# Patient Record
Sex: Male | Born: 1995 | Race: Black or African American | Hispanic: No | Marital: Single | Smoking: Never smoker
Health system: Southern US, Community
[De-identification: ages and names within clinical notes are randomized; demographics above are authoritative.]

## PROBLEM LIST (undated history)

## (undated) HISTORY — PX: ANTERIOR CRUCIATE LIGAMENT REPAIR: SHX115

---
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
## 2014-07-18 IMAGING — CR RIGHT MIDDLE FINGER 2+V
1 series · 3 of 3 positions shown · non-contrast
Comparison: Right hand radiographs 03/25/2010.

CLINICAL DATA: Right 3rd finger injury.

EXAM:
RIGHT MIDDLE FINGER 2+V

[Series 1: pa · 0.17mm/px · 3 of 3 slices shown]
[im 1/3]
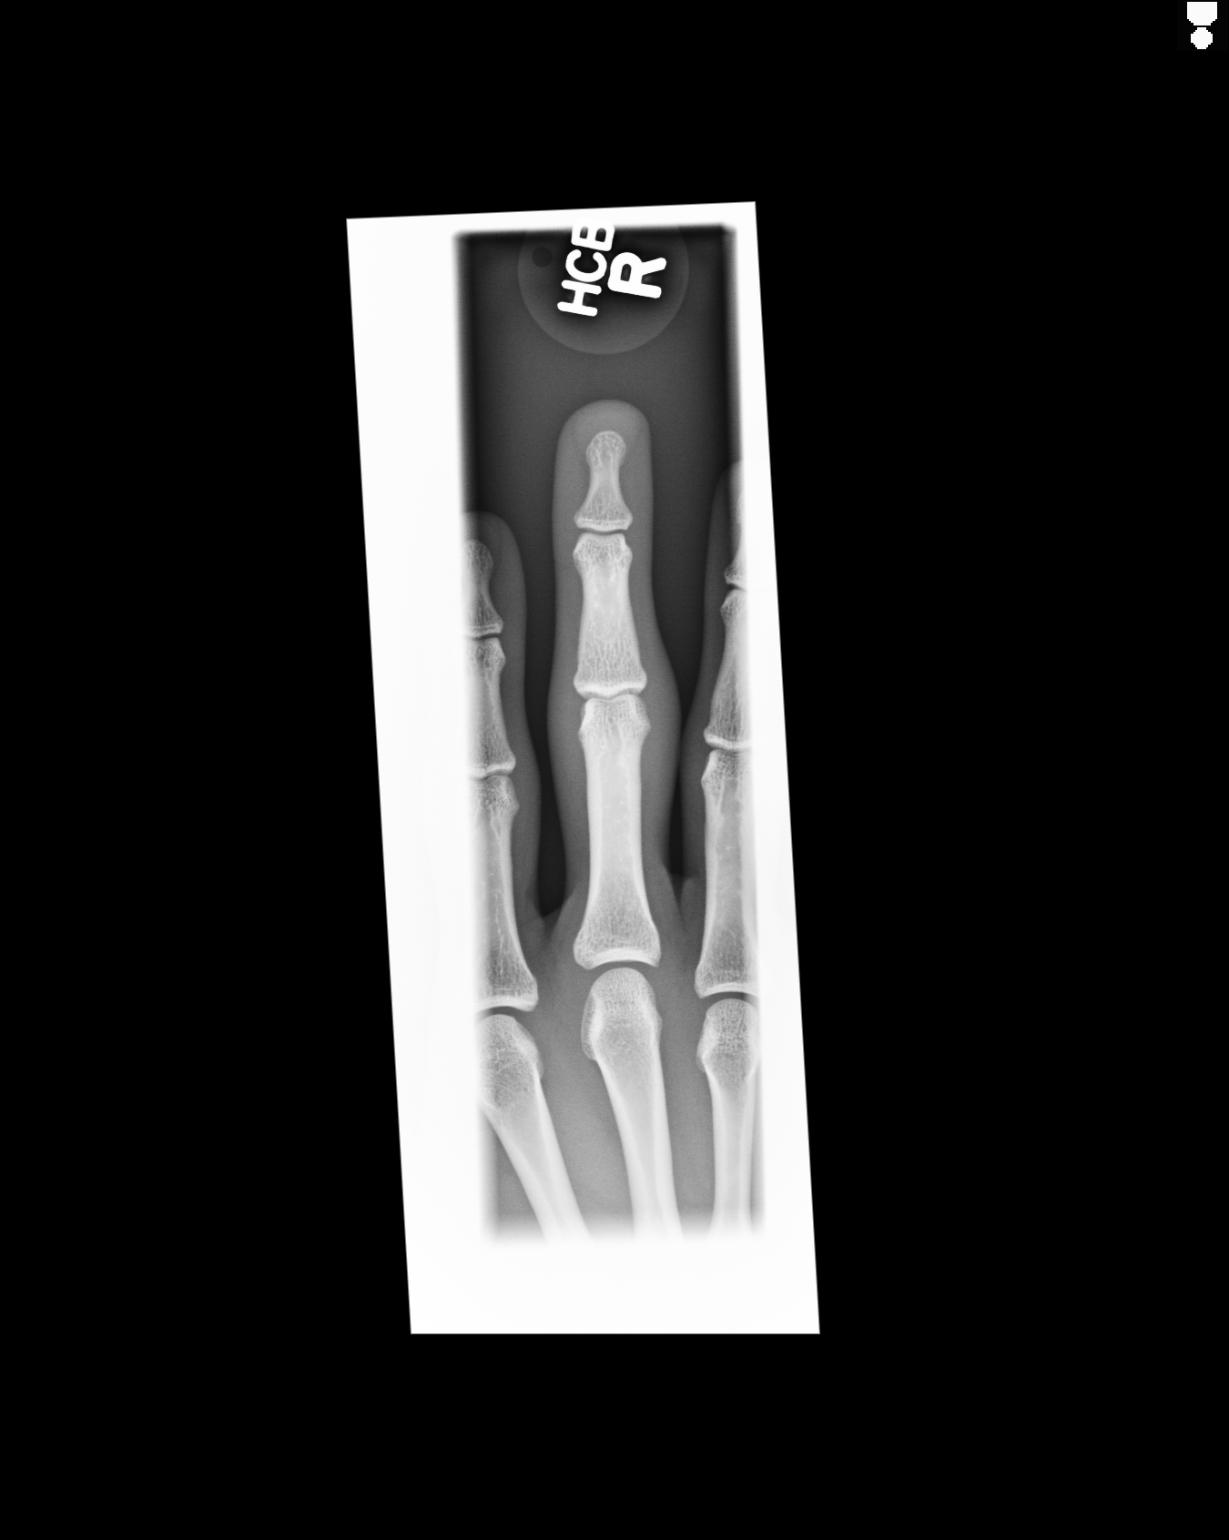
[im 2/3]
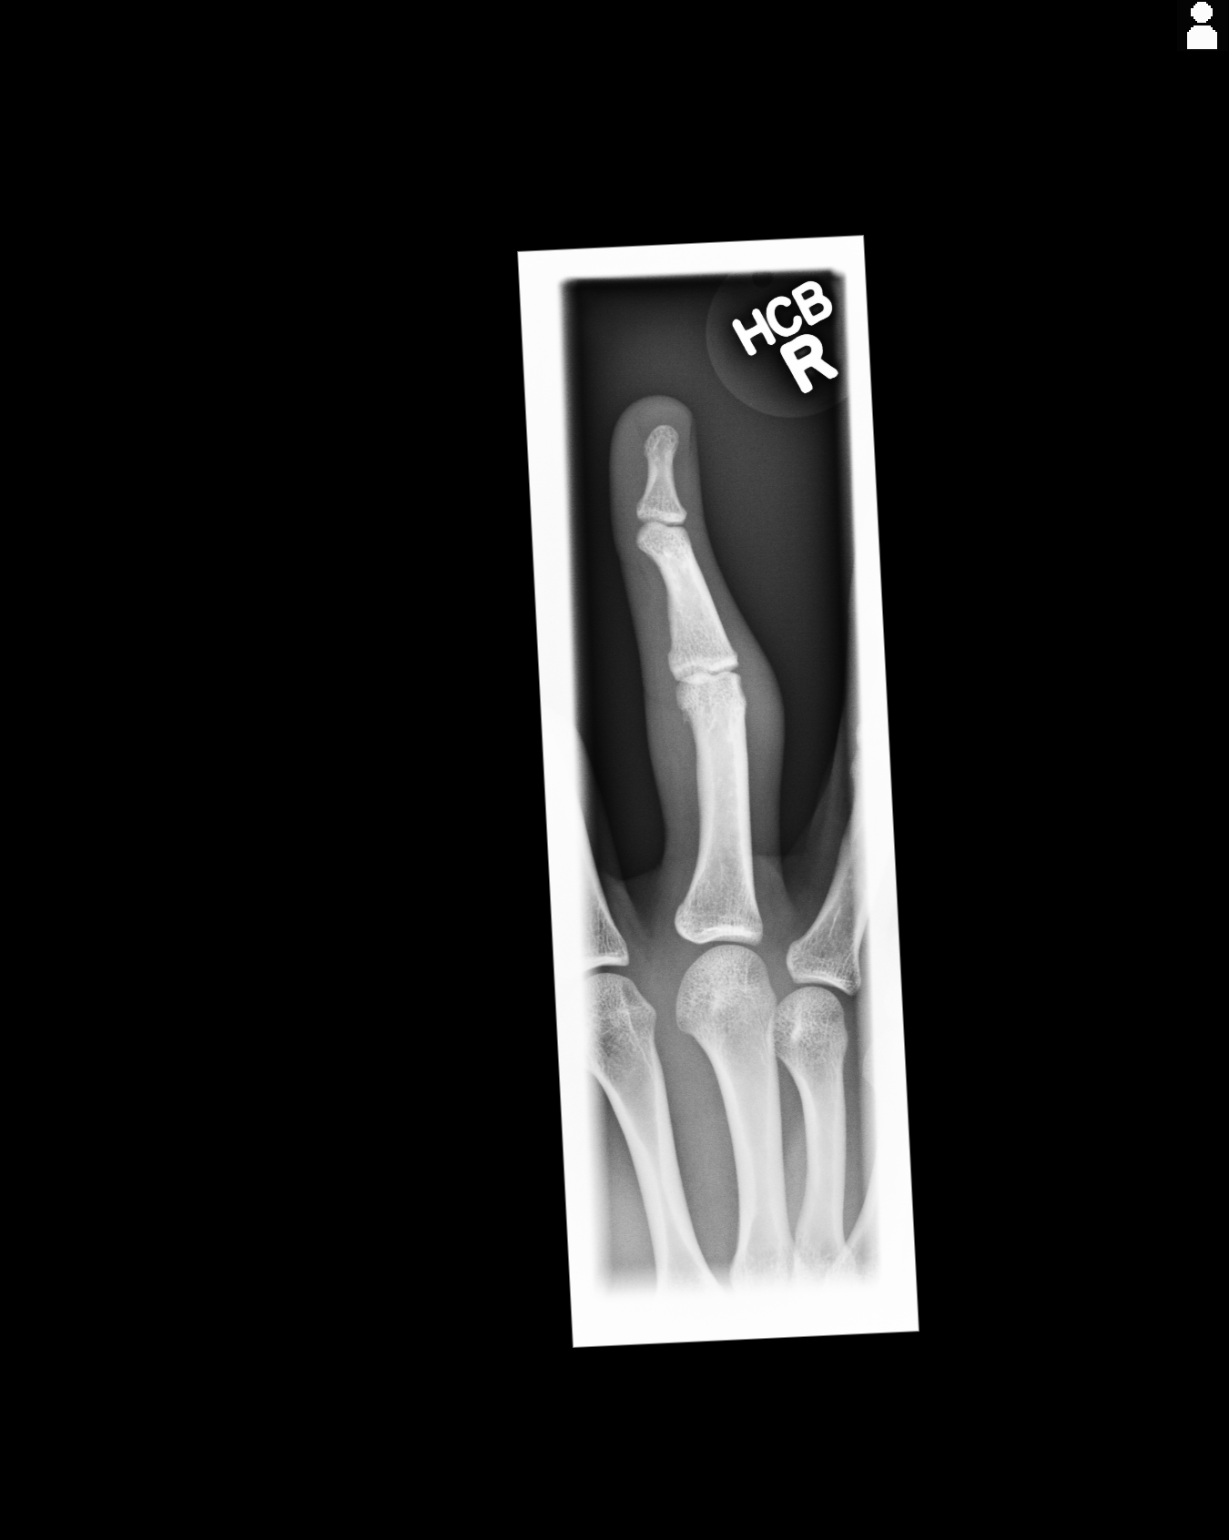
[im 3/3]
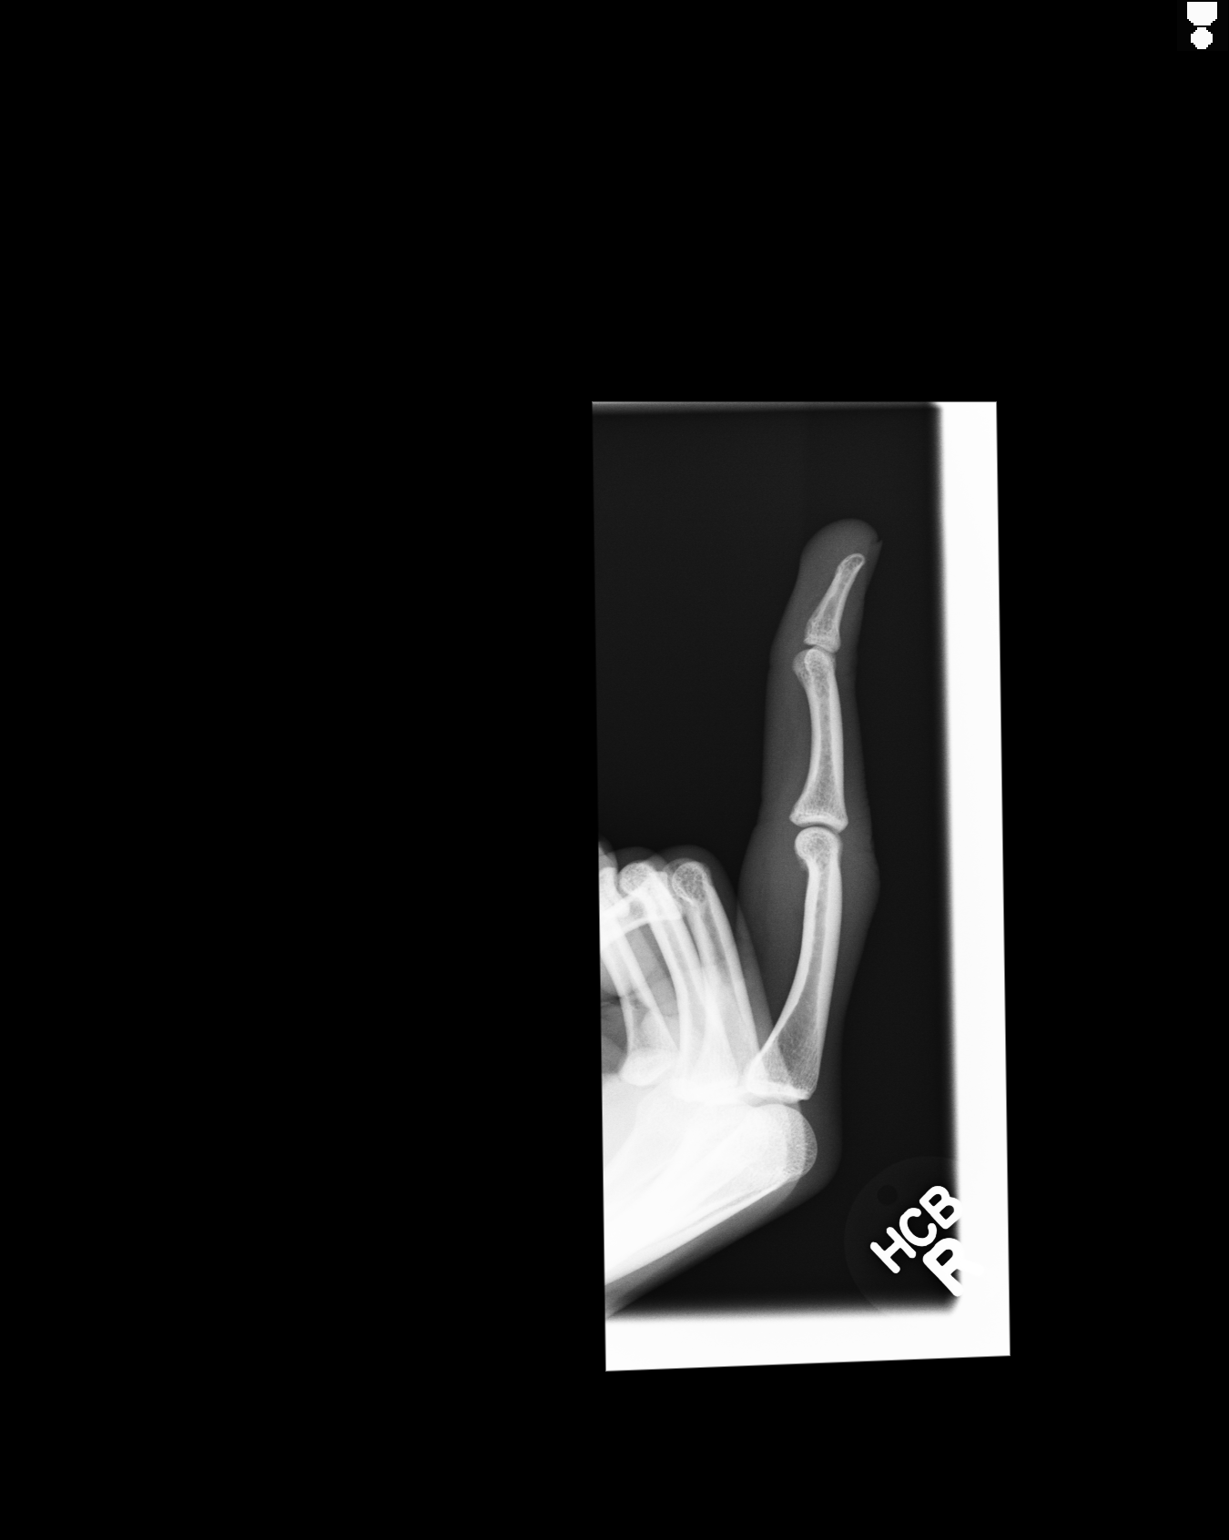

[3 of 3 positions shown; findings below may reference images not displayed]

FINDINGS: Soft tissue swelling is present about the PIP joint. No acute
osseous abnormality is evident. The DIP joint is slightly
hyperexpanded. The DIP joint of the index finger was extended on the
previous film.
IMPRESSION: And soft tissue swelling about the PIP joint without evidence for an
acute fracture.

## 2021-09-18 ENCOUNTER — Other Ambulatory Visit: Payer: Self-pay

## 2021-09-18 ENCOUNTER — Ambulatory Visit
Admission: EM | Admit: 2021-09-18 | Discharge: 2021-09-18 | Disposition: A | Discharge status: Home / Self Care | Payer: BC Managed Care – PPO | Attending: Emergency Medicine | Admitting: Emergency Medicine

## 2021-09-18 ENCOUNTER — Encounter: Payer: Self-pay | Admitting: Emergency Medicine

## 2021-09-18 DIAGNOSIS — B309 Viral conjunctivitis, unspecified: Secondary | ICD-10-CM | POA: Insufficient documentation

## 2021-09-18 DIAGNOSIS — R300 Dysuria: Secondary | ICD-10-CM | POA: Insufficient documentation

## 2021-09-18 LAB — URINALYSIS, COMPLETE (UACMP) WITH MICROSCOPIC
Bilirubin Urine: NEGATIVE
Glucose, UA: NEGATIVE mg/dL
Hgb urine dipstick: NEGATIVE
Ketones, ur: 40 mg/dL — AB
Leukocytes,Ua: NEGATIVE
Nitrite: NEGATIVE
Protein, ur: NEGATIVE mg/dL
Specific Gravity, Urine: 1.02 (ref 1.005–1.030)
pH: 5.5 (ref 5.0–8.0)

## 2021-09-18 NOTE — ED Triage Notes (Signed)
Patient c/o burning when urinating that started 4 days ago.  Patient c/o redness in his right eye that started last night.  Patient reports irritation in his right eye.

## 2021-09-18 NOTE — ED Provider Notes (Signed)
[facility]    CSN: [id number] Arrival date & time: 09/18/21  1256      History   Chief Complaint Chief Complaint  Patient presents with   Dysuria   Eye Problem    right    HPI Dominic Salazar is a 25 y.o. male.   HPI  Dysuria: Patient reports that he has had some dysuria for the past 4 days.  Symptoms are stable and not worsening.  He denies any significant urinary frequency and no pelvic pain, testicular pain, fever or flulike symptoms.  No no new sexual partners.  He has tried to stay hydrated with water for symptoms.   Eye problem: Patient reports that earlier today his eyes felt a bit scratchy.  Occurred in both eyes but worse in the right eye.  He reports that he wears safety glasses at work and denies having any foreign body injury to the eye.  He is scratch his eyes and they feel better now.  He does not wear contacts, no visual changes. Of note he denies any back pain go along with his current symptoms.  History reviewed. No pertinent past medical history.  There are no problems to display for this patient.   Past Surgical History:  Procedure Laterality Date   ANTERIOR CRUCIATE LIGAMENT REPAIR Left        Home Medications    Prior to Admission medications   Not on File    Family History History reviewed. No pertinent family history.  Social History Social History   Tobacco Use   Smoking status: Never   Smokeless tobacco: Never  Vaping Use   Vaping Use: Never used  Substance Use Topics   Alcohol use: Yes   Drug use: Never     Allergies   Amoxicillin   Review of Systems Review of Systems  As stated above in HPI Physical Exam Triage Vital Signs ED Triage Vitals  Enc Vitals Group     BP 09/18/21 1344 (!) 147/86     Pulse Rate 09/18/21 1344 (!) 58     Resp 09/18/21 1344 16     Temp 09/18/21 1344 99 F (37.2 C)     Temp Source 09/18/21 1344 Oral     SpO2 09/18/21 1344 100 %     Weight 09/18/21 1341 150 lb (68 kg)      Height 09/18/21 1341 5\' 9"  (1.753 m)     Head Circumference --      Peak Flow --      Pain Score 09/18/21 1341 0     Pain Loc --      Pain Edu? --      Excl. in GC? --    No data found.  Updated Vital Signs BP (!) 147/86 (BP Location: Right Arm)   Pulse (!) 58   Temp 99 F (37.2 C) (Oral)   Resp 16   Ht 5\' 9"  (1.753 m)   Wt 150 lb (68 kg)   SpO2 100%   BMI 22.15 kg/m   Visual Acuity Right Eye Distance: 20/20 uncorrected Left Eye Distance: 20/20 uncorrected Bilateral Distance: 20/20 uncorrected  Right Eye Near:   Left Eye Near:    Bilateral Near:     Physical Exam Vitals and nursing note reviewed.  Constitutional:      General: He is not in acute distress.    Appearance: Normal appearance. He is not ill-appearing, toxic-appearing or diaphoretic.  HENT:     Head: Normocephalic and atraumatic.  Right Ear: Tympanic membrane normal.     Left Ear: Tympanic membrane normal.     Nose: Nose normal.     Mouth/Throat:     Mouth: Mucous membranes are moist.     Pharynx: Oropharynx is clear. No oropharyngeal exudate or posterior oropharyngeal erythema.  Eyes:     Extraocular Movements: Extraocular movements intact.     Pupils: Pupils are equal, round, and reactive to light.     Comments: Conjunctiva injected bilaterally without evidence of foreign body with fluorescein dye exam  Cardiovascular:     Rate and Rhythm: Normal rate and regular rhythm.     Heart sounds: Normal heart sounds.  Pulmonary:     Effort: Pulmonary effort is normal.     Breath sounds: Normal breath sounds.  Musculoskeletal:     Cervical back: Normal range of motion and neck supple.  Lymphadenopathy:     Cervical: No cervical adenopathy.  Skin:    General: Skin is warm.  Neurological:     Mental Status: He is alert and oriented to person, place, and time.     UC Treatments / Results  Labs (all labs ordered are listed, but only abnormal results are displayed) Labs Reviewed  URINALYSIS,  COMPLETE (UACMP) WITH MICROSCOPIC - Abnormal; Notable for the following components:      Result Value   Ketones, ur 40 (*)    Bacteria, UA FEW (*)    All other components within normal limits  CERVICOVAGINAL ANCILLARY ONLY    EKG   Radiology No results found.  Procedures Procedures (including critical care time)  Medications Ordered in UC Medications - No data to display  Initial Impression / Assessment and Plan / UC Course  I have reviewed the triage vital signs and the nursing notes.  Pertinent labs & imaging results that were available during my care of the patient were reviewed by me and considered in my medical decision making (see chart for details).     New.  I discussed with patient that he likely is starting to experience viral conjunctivitis and we discussed this with patient.  We discussed red flag signs and symptoms.  In terms of his dysuria we discussed that traditionally in men with dysuria with no significant signs of a urinary tract infection or other infection we screen for STIs which she is agreeable with.  We discussed his ketones that are shown in his urine and he states that he has not eaten all day which is likely the cause given no other abnormalities.  Urine culture pending. He will stay hydrated with water and we will follow-up closely as needed. Final Clinical Impressions(s) / UC Diagnoses   Final diagnoses:  Viral conjunctivitis of both eyes  Dysuria     Discharge Instructions      You can use lubricated or gel over-the-counter eyedrops to help with your eyes     ED Prescriptions   None    PDMP not reviewed this encounter.   Rushie Chestnut, [place] 09/18/21 1438

## 2021-09-18 NOTE — Discharge Instructions (Addendum)
You can use lubricated or gel over-the-counter eyedrops to help with your eyes

## 2021-09-23 LAB — MISC LABCORP TEST (SEND OUT): Labcorp test code: 183194
